# Patient Record
Sex: Male | Born: 1935 | Race: White | Hispanic: No | Smoking: Former smoker
Health system: Southern US, Community
[De-identification: ages and names within clinical notes are randomized; demographics above are authoritative.]

## PROBLEM LIST (undated history)

## (undated) DIAGNOSIS — F0390 Unspecified dementia without behavioral disturbance: Secondary | ICD-10-CM

## (undated) HISTORY — PX: PACEMAKER PLACEMENT: SHX43

---

## 2005-11-24 ENCOUNTER — Ambulatory Visit: Payer: Self-pay | Admitting: Gastroenterology

## 2010-08-17 ENCOUNTER — Ambulatory Visit: Payer: Self-pay | Admitting: Internal Medicine

## 2014-07-23 ENCOUNTER — Ambulatory Visit
Admit: 2014-07-23 | Disposition: A | Discharge status: Home / Self Care | Payer: Self-pay | Attending: Cardiology | Admitting: Cardiology

## 2014-07-28 ENCOUNTER — Ambulatory Visit
Admit: 2014-07-28 | Disposition: A | Discharge status: Home / Self Care | Payer: Self-pay | Attending: Cardiology | Admitting: Cardiology

## 2014-07-29 LAB — CBC WITH DIFFERENTIAL/PLATELET
Basophil #: 0 10*3/uL (ref 0.0–0.1)
Basophil %: 0.5 %
Eosinophil #: 0.2 10*3/uL (ref 0.0–0.7)
Eosinophil %: 2.2 %
HCT: 37.2 % — ABNORMAL LOW (ref 40.0–52.0)
HGB: 12.4 g/dL — ABNORMAL LOW (ref 13.0–18.0)
Lymphocyte #: 1.6 10*3/uL (ref 1.0–3.6)
Lymphocyte %: 22.5 %
MCH: 33.3 pg (ref 26.0–34.0)
MCHC: 33.2 g/dL (ref 32.0–36.0)
MCV: 100 fL (ref 80–100)
Monocyte #: 0.6 x10 3/mm (ref 0.2–1.0)
Monocyte %: 8.5 %
Neutrophil #: 4.7 10*3/uL (ref 1.4–6.5)
Neutrophil %: 66.3 %
Platelet: 156 10*3/uL (ref 150–440)
RBC: 3.71 10*6/uL — ABNORMAL LOW (ref 4.40–5.90)
RDW: 14.8 % — ABNORMAL HIGH (ref 11.5–14.5)
WBC: 7 10*3/uL (ref 3.8–10.6)

## 2014-07-29 LAB — BASIC METABOLIC PANEL
Anion Gap: 7 (ref 7–16)
BUN: 21 mg/dL — ABNORMAL HIGH
Calcium, Total: 8.4 mg/dL — ABNORMAL LOW
Chloride: 108 mmol/L
Co2: 25 mmol/L
Creatinine: 0.89 mg/dL
EGFR (African American): >60
EGFR (Non-African Amer.): >60
Glucose: 114 mg/dL — ABNORMAL HIGH
Potassium: 3.5 mmol/L
Sodium: 140 mmol/L

## 2014-07-29 LAB — TSH: Thyroid Stimulating Horm: 3.699 u[IU]/mL

## 2014-08-03 NOTE — Consult Note (Addendum)
PATIENT NAME:  AMAL, SAIKI MR#:  [id number] DATE OF BIRTH:  25-Feb-1921  DATE OF CONSULTATION:  07/29/2014  REFERRING PHYSICIAN:  Dorothyann Peng, MD CONSULTING PHYSICIAN:  Kelton Pillar. Sheryle Hail, MD  REASON FOR CONSULTATION: Clinical question regarding postoperative agitation.   HISTORY OF PRESENT ILLNESS: This is a 81 year old Caucasian male who was admitted to the hospital for a pacemaker placement due to bradycardia. This evening, after the patient's procedure, he was very agitated and difficult to redirect. The procedure was without complication, but due to the patient's change in mental status his attending physicians called for input from the internal medicine service.   REVIEW OF SYSTEMS: CONSTITUTIONAL: The patient is sleeping soundly at this time and I did not want to wake him for this particular consult, but his nursing staff and his son who were present during the episode are available to answer questions. The patient denied any pain, nausea, vomiting, or shortness of breath. During the patient's acute episode of agitation, he reportedly did not express any complaints at that time either.   PAST MEDICAL HISTORY: A-fib, bradycardia and short term memory loss.   PAST SURGICAL HISTORY: Cataract removal and pacemaker placement today.   SOCIAL HISTORY: The patient lives alone but he drives. The patient plays tennis 2 times a week and plays in a weekly card game. He also attends church and takes care of his own cooking and cleaning.   FAMILY HISTORY: There are no significant chronic medical illnesses that run through the family.   MEDICATIONS: Simvastatin 20 mg 1 tablet p.o. at bedtime.   ALLERGIES: DAYPRO.   PERTINENT LABORATORY RESULTS AND RADIOGRAPHIC FINDINGS: Unavailable as the patient does not have any lab work.   PHYSICAL EXAMINATION: VITAL SIGNS: Temperature 97, pulse 66, respirations 18, blood pressure 180/116, and pulse oximetry of 94% on room air.  GENERAL: The patient is  sleeping comfortably, in no apparent distress.  HEENT: Normocephalic, atraumatic. Pupils equal, round, and reactive to light and accommodation. Mucous membranes are moist.  NECK: Trachea is midline. No adenopathy. Thyroid is nonpalpable and nontender.  CHEST: Symmetric and atraumatic. There is a surgical incision that is clean and dressed over the left chest.  CARDIOVASCULAR: Regular rate and rhythm. Normal S1, S2. No rubs, clicks, or murmurs appreciated.  LUNGS: There are transmitted upper airway sounds throughout all lung fields. The patient is snoring as he is lying on his back and sleeping. He also has a slight whistle that is heard best with the stethoscope above the cricoid notch.  ABDOMEN: Positive bowel sounds. Soft, nontender, nondistended. No hepatosplenomegaly.  GENITOURINARY: Deferred.  MUSCULOSKELETAL: The patient is observed moving all 4 extremities equally as he rolls in bed. Again, I have not awakened him to test his strength nor his gait.  SKIN: Warm and dry. There are no rashes or lesions.  EXTREMITIES: No clubbing, cyanosis, or edema.  NEUROLOGIC: Reportedly while the patient was awake, cranial nerves II through XII were grossly intact. There is no indication that the patient has had any stroke or cerebrovascular accident. PSYCHIATRIC: The patient is asleep, thus his mood is difficult to assess at this time, although notably when he was awake and agitated he was pleasant and nonviolent.   ASSESSMENT AND PLAN: This is a 81 year old male status post pacemaker implantation who appears to have been agitated due to postoperative delirium.  1.  Delirium. The patient has recently undergone anesthesia and apparently has some baseline mild cognitive impairment, which is a combination that makes delirium following a  procedure quite likely. He was initially given Ativan which in elderly patients can frequently have an opposite effect from the usual anxiolytic reaction. Prior to my examination,  the patient had been given some Haldol IV, which has helped him calm down and hopefully contributed to his falling asleep. I recommend obtaining baseline laboratories including basic metabolic panel as well as CBC. I have also ordered labs to look for reversible causes of delirium.  2.  Mild cognitive impairment, early dementia. The patient is very functional at home. His son says that the patient has never had any episodes of acute delirium such as this, however, he has noticed that the patient's short-term memory has worsened recently. He has also lost his keys and wallet recently, which indicate that he may benefit from some memory care or in-home help. Thus, I have ordered a discharge planning consult as well as physical therapy and occupational therapy evaluations to determine his level of need. Prior to this admission, the patient had actually expressed desire to move into an elderly independent living community. Given his ability to take care of most of his ADL, I would seriously doubt that he meets criteria for outright dementia and thus he likely has mild cognitive impairment. When the patient is awake and calm, which I fully expect he will be from sunrise, we can perform a mental status examination and score his level of dementia. At this time, I do not believe obtaining a head CT is necessary as there are very clear causes for delirium. If his mental status is not improved by morning, a head CT might be appropriate to rule out any cerebrovascular accident.  3.  Hypertension. High blood pressure was not listed on the patient's past medical history. It may be that he has an ill-fitting cuff or that he had been agitated at the time that his previous blood pressure readings were taken. I would assess blood pressure again in the morning when he is calmer and determine if he would benefit from an antihypertensive medication keeping in mind that we do not want to make the patient orthostatic and increase his  falls risk as she is very able-bodied at this time and lives a high quality of life.   Thank you very much for involving me in this patient's care. We will follow alongside the primary cardiology team.   ____________________________ Kelton PillarMichael S. Sheryle Hailiamond, MD msd:sb D: 07/29/2014 06:55:30 ET T: 07/29/2014 10:00:33 ET JOB#: [id number]  cc: Kelton PillarMichael S. Sheryle Hailiamond, MD, <Dictator> Kelton PillarMICHAEL S Alan Drummer MD ELECTRONICALLY SIGNED 08/05/2014 17:56

## 2014-08-03 NOTE — Consult Note (Signed)
Brief Consult Note: Diagnosis: post-operative delirium; mild-cognitive impairment/early dementia.   Patient was seen by consultant.   Consult note dictated.   Recommend further assessment or treatment.   Orders entered.   Comments: Obtain history and physical; check baseline labs.  Electronic Signatures: Arnaldo Nataliamond, Michael S (MD)  (Signed 26-Apr-16 01:02)  Authored: Brief Consult Note   Last Updated: 26-Apr-16 01:02 by Arnaldo Nataliamond, Michael S (MD)

## 2014-08-03 NOTE — Op Note (Addendum)
PATIENT NAME:  Mark Miller, [name] MR#:  [id number] DATE OF BIRTH:  1921-02-19  DATE OF PROCEDURE:  07/28/2014  PRIMARY CARE PHYSICIAN:  Stann Mainlandavid P. Sampson GoonFitzgerald, M.D.   PREPROCEDURE DIAGNOSIS: Bradycardia.   PROCEDURE: Single-chamber pacemaker implantation.  POSTPROCEDURAL DIAGNOSIS:  Intermittent ventricular pacing.   INDICATION: The patient is a 81 year old gentleman with 1 month history of increasing exertional dyspnea with generalized fatigue. Workup has revealed atrial fibrillation with a slow ventricular response with heart rates in the 40s.   DESCRIPTION OF PROCEDURE: The risks, benefits and alternatives of permanent pacemaker implantation were explained to the patient and informed written consent was obtained.  He was brought to the operating room in the fasting state. The left pectoral region was prepped and draped in the usual sterile manner. Anesthesia was obtained with 1% lidocaine locally. A 6 cm incision was performed over the left pectoral region. Access was obtained to the left subclavian vein by fine needle aspiration. Ventricular lead was positioned into the right ventricular apical septum. After proper thresholds were obtained, the lead was sutured in place. The pacemaker pocket was irrigated with gentamicin solution. The lead was connected to rate-responsive single-chamber pacemaker generator (Medtronic Adapta [id number]) and positioned in the pocket. The pocket was closed with 2-0 and 4-0 Vicryl, respectively. Steri-Strips and a pressure dressing were applied.    ____________________________ Marcina MillardAlexander Kenn Rekowski, MD ap:sp D: 07/28/2014 13:02:04 ET T: 07/28/2014 16:59:08 ET JOB#: [id number]  cc: Marcina MillardAlexander Shaughn Thomley, MD, <Dictator> Marcina MillardALEXANDER Phillipe Clemon MD ELECTRONICALLY SIGNED 08/02/2014 11:10

## 2015-08-28 ENCOUNTER — Emergency Department
Admission: EM | Admit: 2015-08-28 | Discharge: 2015-08-28 | Disposition: A | Discharge status: Skilled Nursing Facility | Payer: Medicare Other | Attending: Emergency Medicine | Admitting: Emergency Medicine

## 2015-08-28 ENCOUNTER — Emergency Department: Payer: Medicare Other

## 2015-08-28 ENCOUNTER — Encounter: Payer: Self-pay | Admitting: Emergency Medicine

## 2015-08-28 DIAGNOSIS — Z87891 Personal history of nicotine dependence: Secondary | ICD-10-CM | POA: Insufficient documentation

## 2015-08-28 DIAGNOSIS — R4182 Altered mental status, unspecified: Secondary | ICD-10-CM | POA: Diagnosis present

## 2015-08-28 DIAGNOSIS — F0391 Unspecified dementia with behavioral disturbance: Secondary | ICD-10-CM | POA: Diagnosis not present

## 2015-08-28 HISTORY — DX: Unspecified dementia, unspecified severity, without behavioral disturbance, psychotic disturbance, mood disturbance, and anxiety: F03.90

## 2015-08-28 LAB — CBC WITH DIFFERENTIAL/PLATELET
Basophils Absolute: 0 10*3/uL (ref 0–0.1)
Basophils Relative: 1 %
EOS PCT: 3 %
Eosinophils Absolute: 0.2 10*3/uL (ref 0–0.7)
HCT: 39.8 % — ABNORMAL LOW (ref 40.0–52.0)
HEMOGLOBIN: 13.1 g/dL (ref 13.0–18.0)
LYMPHS ABS: 2.7 10*3/uL (ref 1.0–3.6)
LYMPHS PCT: 40 %
MCH: 32.5 pg (ref 26.0–34.0)
MCHC: 33 g/dL (ref 32.0–36.0)
MCV: 98.6 fL (ref 80.0–100.0)
MONOS PCT: 8 %
Monocytes Absolute: 0.5 10*3/uL (ref 0.2–1.0)
Neutro Abs: 3.2 10*3/uL (ref 1.4–6.5)
Neutrophils Relative %: 48 %
Platelets: 124 10*3/uL — ABNORMAL LOW (ref 150–440)
RBC: 4.04 MIL/uL — ABNORMAL LOW (ref 4.40–5.90)
RDW: 14.6 % — ABNORMAL HIGH (ref 11.5–14.5)
WBC: 6.6 10*3/uL (ref 3.8–10.6)

## 2015-08-28 LAB — URINALYSIS COMPLETE WITH MICROSCOPIC (ARMC ONLY)
BILIRUBIN URINE: NEGATIVE
Bacteria, UA: NONE SEEN
GLUCOSE, UA: NEGATIVE mg/dL
Ketones, ur: NEGATIVE mg/dL
Leukocytes, UA: NEGATIVE
Nitrite: NEGATIVE
Protein, ur: NEGATIVE mg/dL
SPECIFIC GRAVITY, URINE: 1.024 (ref 1.005–1.030)
SQUAMOUS EPITHELIAL / LPF: NONE SEEN
pH: 5 (ref 5.0–8.0)

## 2015-08-28 LAB — COMPREHENSIVE METABOLIC PANEL
ALT: 23 U/L (ref 17–63)
AST: 34 U/L (ref 15–41)
Albumin: 4 g/dL (ref 3.5–5.0)
Alkaline Phosphatase: 69 U/L (ref 38–126)
Anion gap: 7 (ref 5–15)
BILIRUBIN TOTAL: 0.8 mg/dL (ref 0.3–1.2)
BUN: 30 mg/dL — AB (ref 6–20)
CO2: 24 mmol/L (ref 22–32)
Calcium: 8.8 mg/dL — ABNORMAL LOW (ref 8.9–10.3)
Chloride: 109 mmol/L (ref 101–111)
Creatinine, Ser: 0.94 mg/dL (ref 0.61–1.24)
GFR calc Af Amer: >60 mL/min (ref >60)
GFR calc non Af Amer: >60 mL/min (ref >60)
Glucose, Bld: 92 mg/dL (ref 65–99)
POTASSIUM: 4.1 mmol/L (ref 3.5–5.1)
Sodium: 140 mmol/L (ref 135–145)
TOTAL PROTEIN: 6.4 g/dL — AB (ref 6.5–8.1)

## 2015-08-28 LAB — GLUCOSE, CAPILLARY: Glucose-Capillary: 91 mg/dL (ref 65–99)

## 2015-08-28 MED ORDER — QUETIAPINE FUMARATE 25 MG PO TABS: 12.5000 mg | ORAL_TABLET | Freq: Two times a day (BID) | ORAL | Status: AC

## 2015-08-28 NOTE — ED Notes (Signed)
Per patient son, patient has a history of dementia, over the past few days his behavior has became more aggressive. [facility] son to come today because he became so aggressive. Patient denies any complaints at this time.  Patient son states that approximately 3 week ago patient was started on Celexa 20 mg.

## 2015-08-28 NOTE — Discharge Instructions (Signed)

## 2015-08-28 NOTE — ED Notes (Signed)
[facility] pt with hx of Dementia , with x1 week of increased alerted behavior with aggression. Pt with Healthcare  POA

## 2015-08-28 NOTE — ED Provider Notes (Addendum)
[facility] Emergency Department Provider Note  ____________________________________________   I have reviewed the triage vital signs and the nursing notes.   HISTORY  Chief Complaint Altered Mental Status    HPI Mark Miller Sr. is a 82 y.o. male presents today complaining of nothing. The patient has a history of dementia.He is able were to that, his family states that he has been getting gradually more demented over the last several years and recently had to be put in a facility. Over the last several weeks, there is been a gradual increase in his dissatisfaction with and outbursts about being in the facility. There is been no acute change for the last couple days. Apparently, he was adamant that he would go home today and this caused some concern. His doctor's office was closed apparently so they called the son asked him to be evaluated  to ensure there was no other issues such as an acute urinary tract infection. They feel that he is at his baseline. They think he is bored the facility. They do not wish him admitted at this time. Son states that the patient can do any pain that is required of him in terms of playing cards but cannot recall what he had for breakfast in the morning and this is been getting gradually worse over time   Past Medical History  Diagnosis Date  . Dementia     There are no active problems to display for this patient.   Past Surgical History  Procedure Laterality Date  . Pacemaker placement      No current outpatient prescriptions on file.  Allergies Review of patient's allergies indicates no known allergies.  No family history on file.  Social History Social History  Substance Use Topics  . Smoking status: Former Games developer  . Smokeless tobacco: None  . Alcohol Use: None    Review of Systems Limited second to patient dementia Constitutional: No fever/chills Eyes: No visual changes. ENT: No sore throat. No  stiff neck no neck pain Cardiovascular: Denies chest pain. Respiratory: Denies shortness of breath. Gastrointestinal:   no vomiting.  No diarrhea.  No constipation. Genitourinary: Negative for dysuria. Musculoskeletal: Negative lower extremity swelling Skin: Negative for rash. Neurological: Negative for headaches, focal weakness or numbness. 10-point ROS otherwise negative.  ____________________________________________   PHYSICAL EXAM:  VITAL SIGNS: ED Triage Vitals  Enc Vitals Group     BP 08/28/15 1617 120/74 mmHg     Pulse Rate 08/28/15 1617 62     Resp 08/28/15 1617 18     Temp 08/28/15 1617 98.1 F (36.7 C)     Temp Source 08/28/15 1617 Oral     SpO2 08/28/15 1617 97 %     Weight 08/28/15 1617 160 lb (72.576 kg)     Height 08/28/15 1617  (1.753 m)     Head Cir --      Peak Flow --      Pain Score --      Pain Loc --      Pain Edu? --      Excl. in GC? 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 Well appearing and in no acute distress. Pleasantly demented Eyes: Conjunctivae are normal. PERRL. EOMI. Head: Atraumatic. Nose: No congestion/rhinnorhea. Mouth/Throat: Mucous membranes are moist.  Oropharynx non-erythematous. Neck: No  stridor.   Nontender with no meningismus Cardiovascular: Normal rate, regular rhythm. Grossly normal heart sounds.  Good peripheral circulation. Respiratory: Normal respiratory effort.  No retractions. Lungs CTAB. Abdominal: Soft and nontender. No distention. No guarding no rebound Back:  There is no focal tenderness or step off there is no midline tenderness there are no lesions noted. there is no CVA tenderness Musculoskeletal: No lower extremity tenderness. No joint effusions, no DVT signs strong distal pulses no edema Neurologic:  Normal speech and language. No gross focal neurologic  deficits are appreciated.  Skin:  Skin is warm, dry and intact. No rash noted. Psychiatric: Mood and affect are normal. Speech and behavior are normal.  ____________________________________________   LABS (all labs ordered are listed, but only abnormal results are displayed)  Labs Reviewed  COMPREHENSIVE METABOLIC PANEL - Abnormal; Notable for the following:    BUN 30 (*)    Calcium 8.8 (*)    Total Protein 6.4 (*)    All other components within normal limits  URINALYSIS COMPLETEWITH MICROSCOPIC ([facility] ONLY) - Abnormal; Notable for the following:    Color, Urine YELLOW (*)    APPearance CLEAR (*)    Hgb urine dipstick 1+ (*)    All other components within normal limits  GLUCOSE, CAPILLARY  CBC WITH DIFFERENTIAL/PLATELET  CBG MONITORING, ED   ____________________________________________  EKG  I personally interpreted any EKGs ordered by me or triage  ____________________________________________  RADIOLOGY  I reviewed any imaging ordered by me or triage that were performed during my shift and, if possible, patient and/or family made aware of any abnormal findings. ____________________________________________   PROCEDURES  Procedure(s) performed: None  Critical Care performed: None  ____________________________________________   INITIAL IMPRESSION / ASSESSMENT AND PLAN / ED COURSE  Pertinent labs & imaging results that were available during my care of the patient were reviewed by me and considered in my medical decision making (see chart for details).  Patient here for ongoing dementia issues, no acute pathology noted no evidence of urinary tract infection afebrile vital signs reassuring I will get a chest x-ray as a precaution no indication for CT had no head trauma no acute change in mental status no neurologic deficits. Family does not wish the patient to be admitted to the hospital and I see no acute pressing issue today. He will need to follow closely with his  doctor for ongoing management of his dementia. The son would prefer not to have the patient moved into a memory ward, but they are aware that this might need to happen if the patient continues to deteriorate in terms of dementia  ----------------------------------------- 7:42 PM on 08/28/2015 -----------------------------------------  Discussed with Dr. Toni Amendlapacs of psychiatry feels that low dose twice a day 12.5 Seroquel may be a good adjuvant to the medication patient is already taking. We will give him that and see if that helps with close outpatient follow-up. ____________________________________________   FINAL CLINICAL IMPRESSION(S) / ED DIAGNOSES  Final diagnoses:  None      This chart was dictated using voice recognition software.  Despite best efforts to proofread,  errors can occur which can change meaning.     Jeanmarie PlantJames A Yatzil Clippinger, MD 08/28/15 1910  Jeanmarie PlantJames A Kamee Bobst, MD 08/28/15 [phone]

## 2015-09-24 ENCOUNTER — Encounter
Admission: RE | Admit: 2015-09-24 | Discharge: 2015-09-24 | Disposition: A | Discharge status: Home / Self Care | Payer: Medicare Other | Source: Ambulatory Visit | Attending: Internal Medicine | Admitting: Internal Medicine

## 2015-10-03 ENCOUNTER — Encounter
Admission: RE | Admit: 2015-10-03 | Discharge: 2015-10-03 | Disposition: A | Discharge status: Home / Self Care | Payer: Medicare Other | Source: Ambulatory Visit | Attending: Internal Medicine | Admitting: Internal Medicine

## 2016-05-05 IMAGING — CR DG CHEST 2V
1 series · 2 of 2 positions shown · non-contrast
Comparison: None.

CLINICAL DATA: Pre operative respiratory exam. The patient is to
have a pacemaker inserted. Atrial fibrillation.

EXAM:
CHEST  2 VIEW

[Series 1: w chest pa · 0.14mm/px · 2 of 2 slices shown]
[im 1/2]
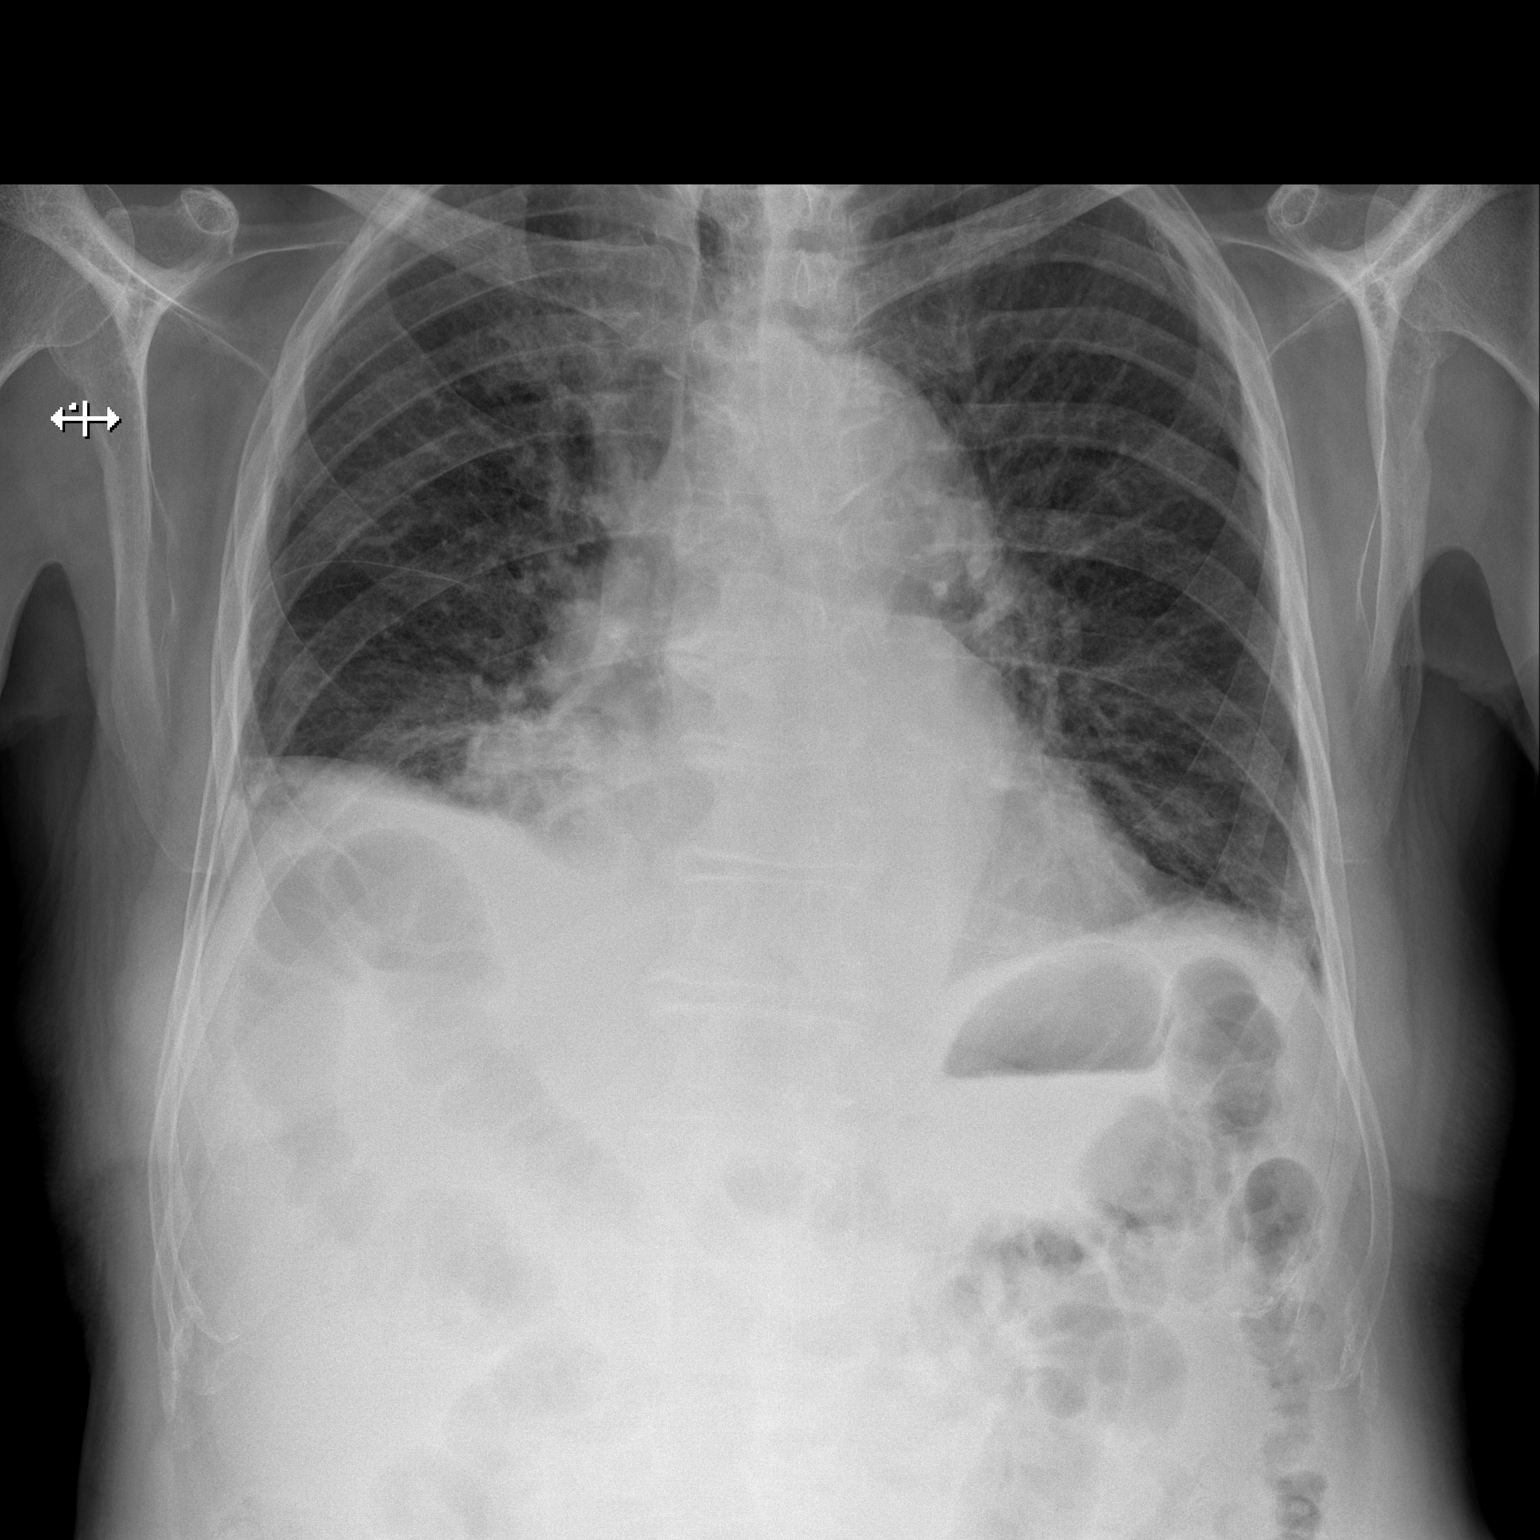
[im 2/2]
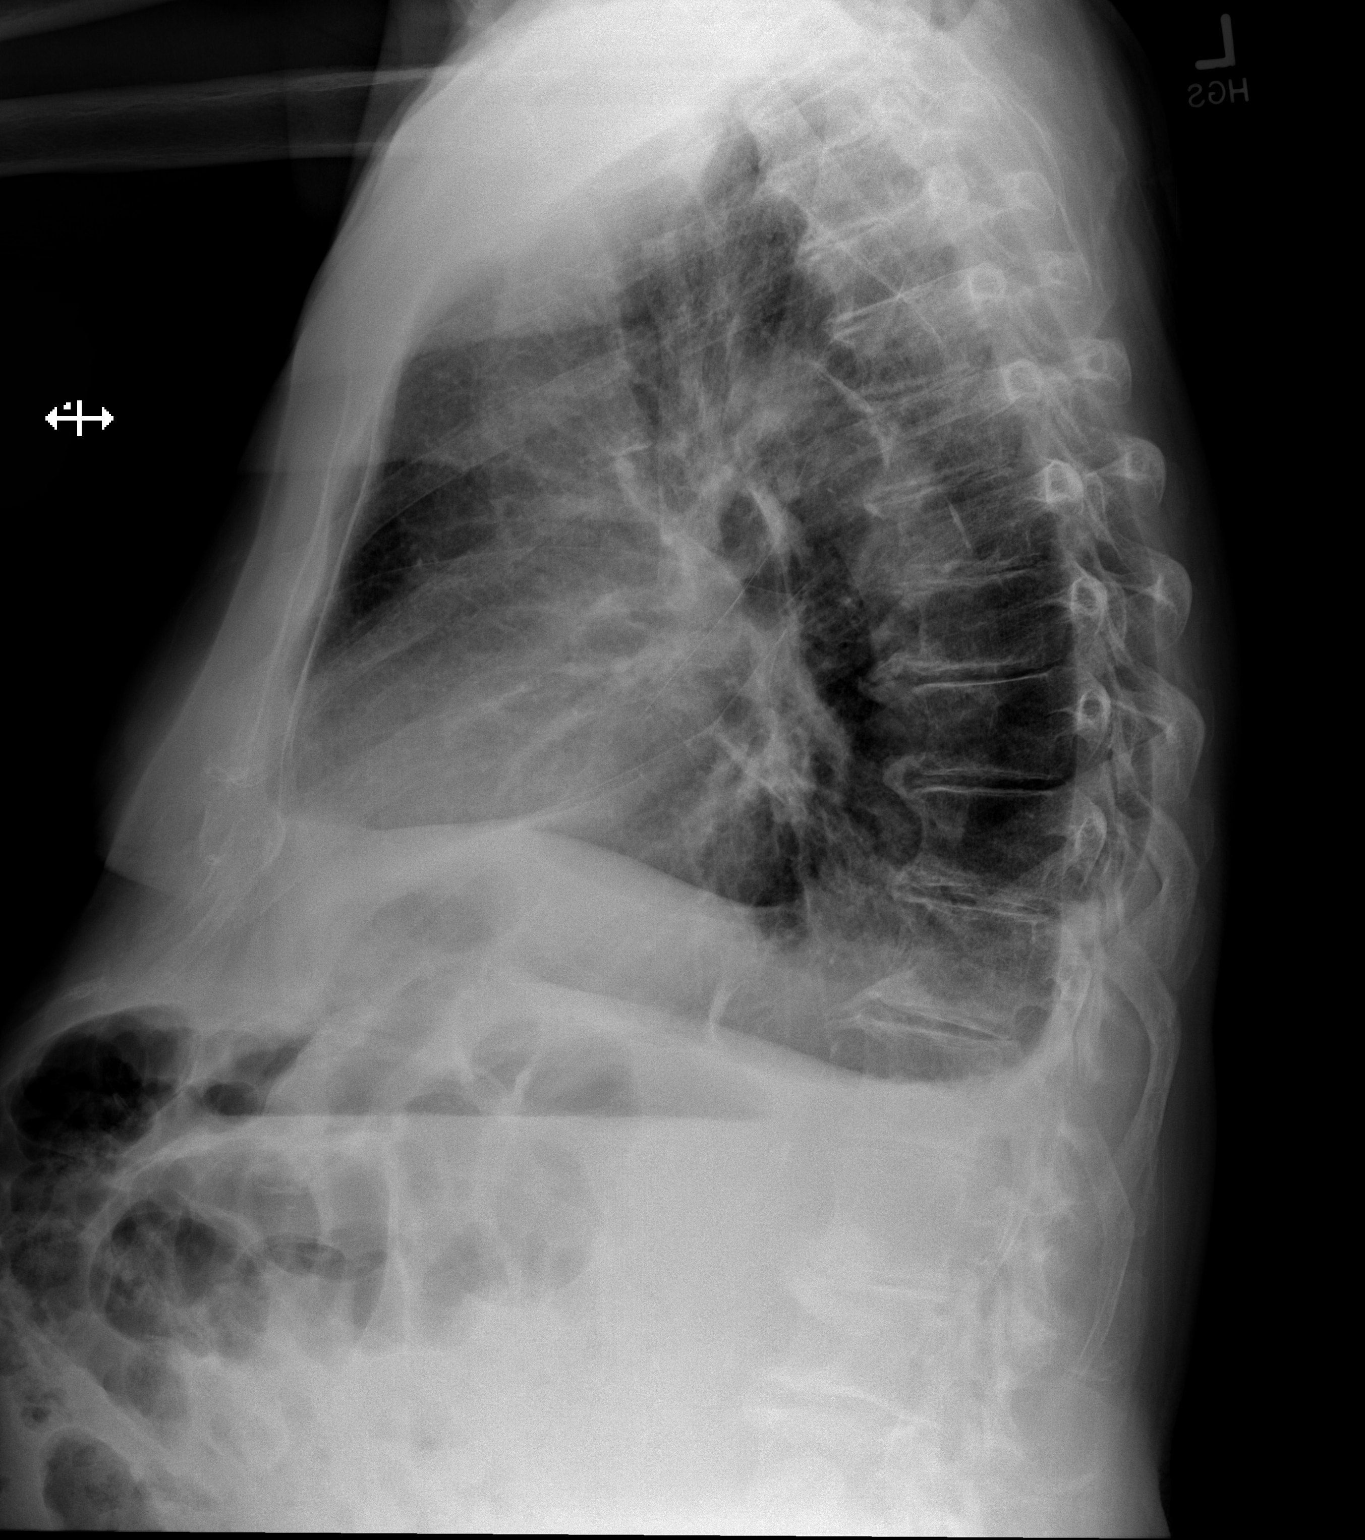

[2 of 2 positions shown; findings below may reference images not displayed]

FINDINGS: There is borderline cardiomegaly. Tortuosity and calcification of
the thoracic aorta. Small bilateral pleural effusions. Pulmonary
vascularity is normal. Slight atelectasis or infiltrate at the right
base posterior medially.

No acute osseous abnormality.
IMPRESSION: Right base atelectasis/ infiltrate.  Small bilateral effusions.

## 2016-05-10 IMAGING — CR DG CHEST 1V PORT
1 series · 1 of 1 positions shown · non-contrast
Comparison: July 23, 2014

CLINICAL DATA: Pacemaker placement

EXAM:
DG C-ARM 61-120 MIN; PORTABLE CHEST - 1 VIEW

[ap]
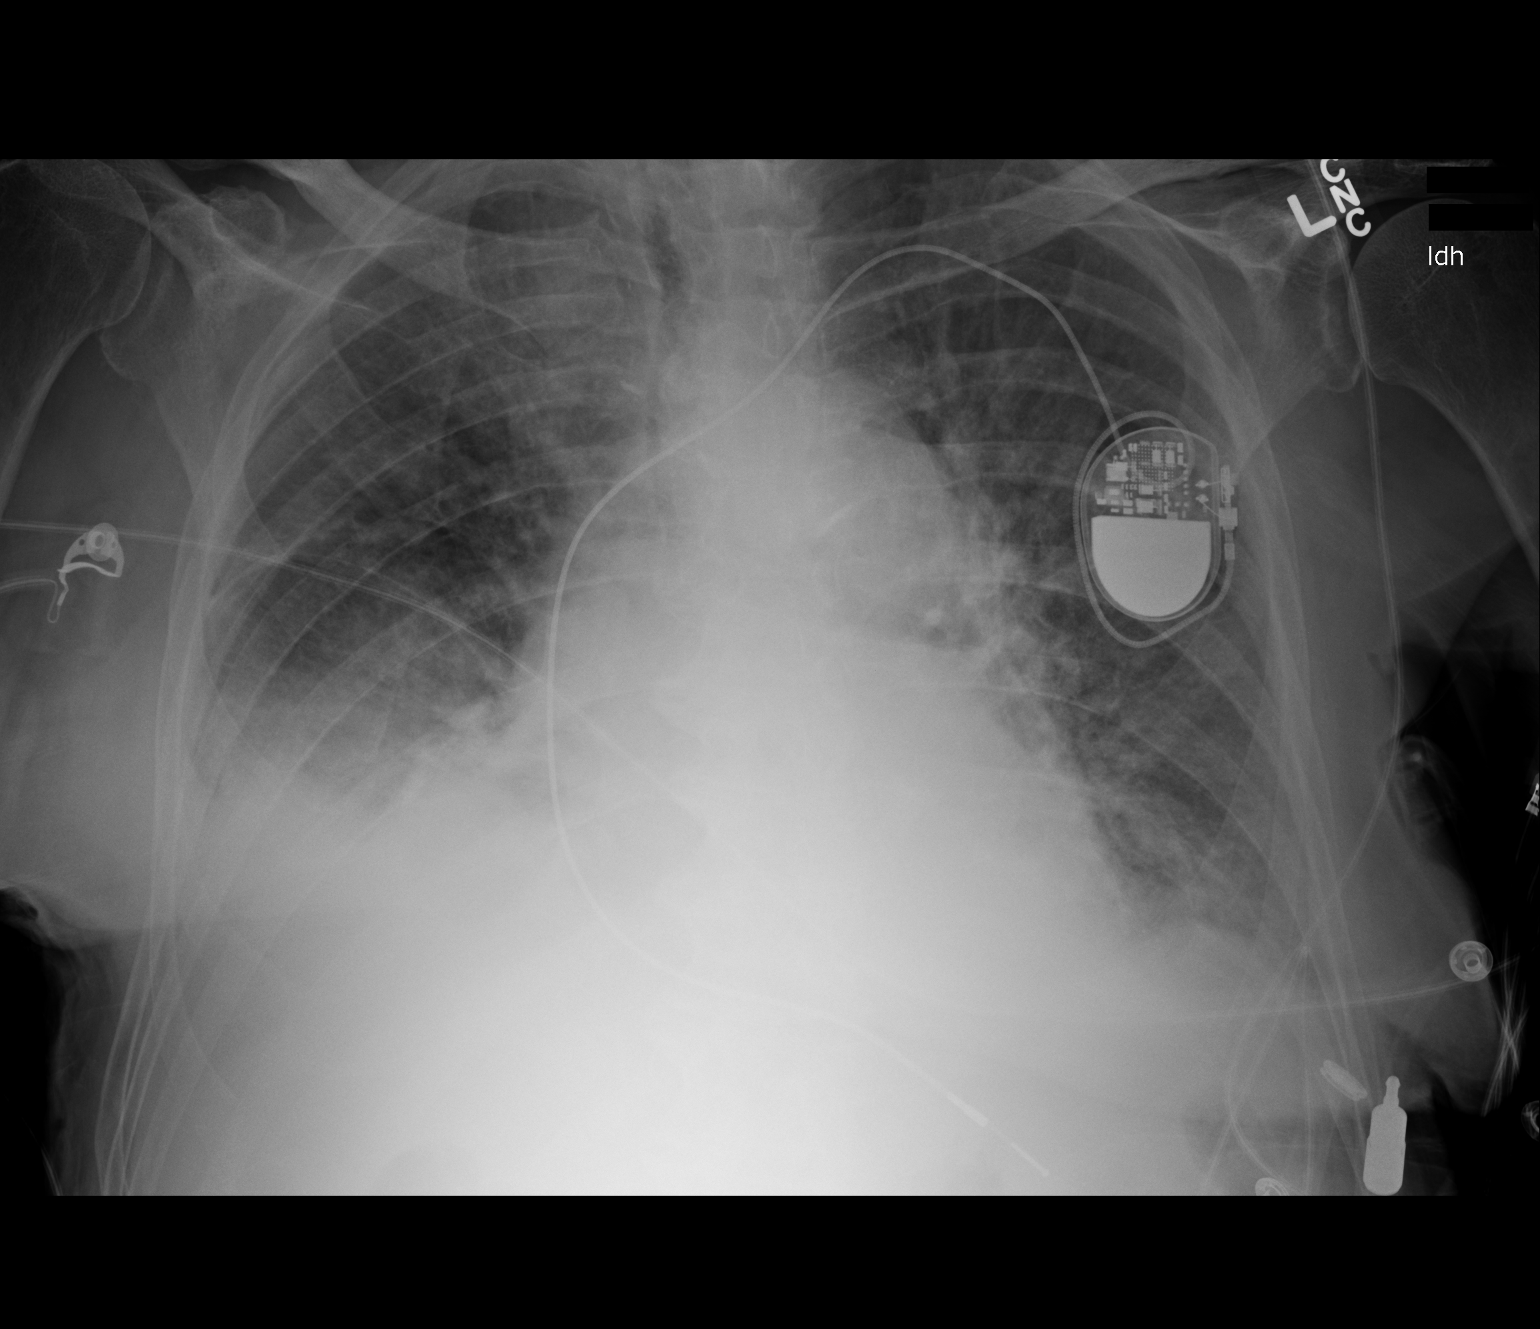

[1 of 1 positions shown; findings below may reference images not displayed]

FINDINGS: Pacemaker lead is attached to the right ventricular apex. No
pneumothorax. There is airspace consolidation in the right base with
small right effusion. There is patchy infiltrate in the medial left
base as well. Heart is upper normal in size with pulmonary
vascularity within normal limits. No adenopathy.
IMPRESSION: Pacemaker lead tip attached to the right ventricular apex region. No
pneumothorax. Bibasilar airspace consolidation, more on the right
than on the left. Small right effusion. No change in cardiac
silhouette.

## 2017-06-10 IMAGING — CR DG CHEST 2V
2 series · 3 of 3 positions shown · non-contrast
Comparison: 07/28/2014

CLINICAL DATA: Worsening dementia.

EXAM:
CHEST  2 VIEW

[chest pa]
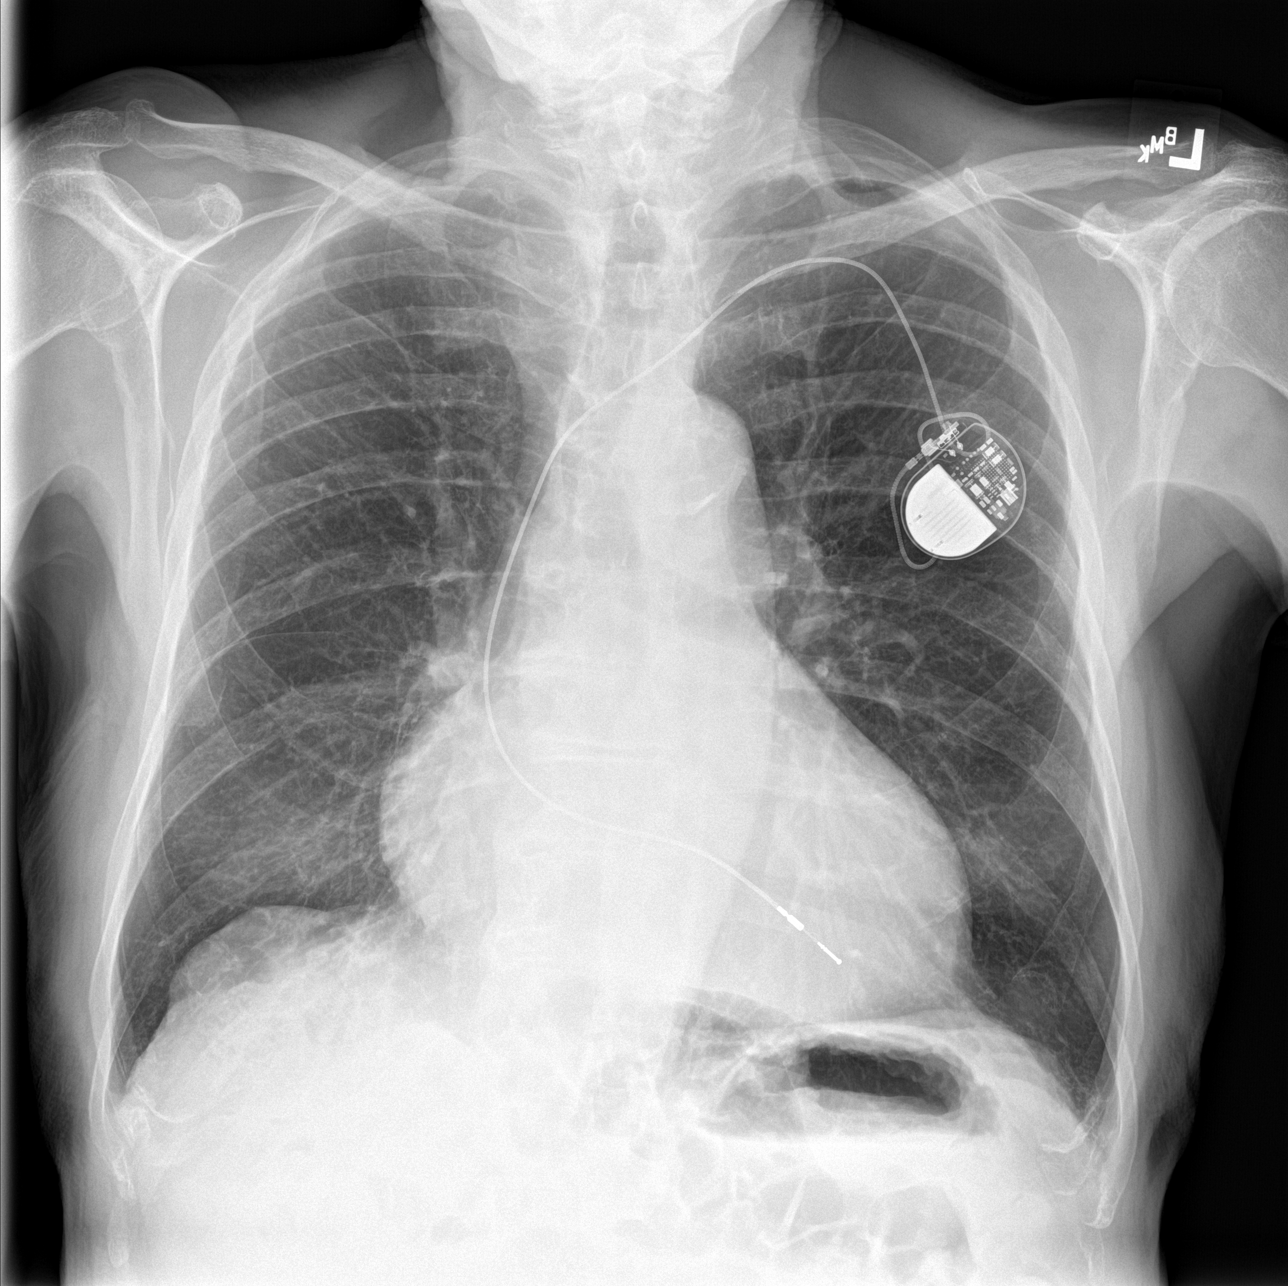

[Series 2: chest lat · 0.14mm/px · 2 of 2 slices shown]
[im 1/2]
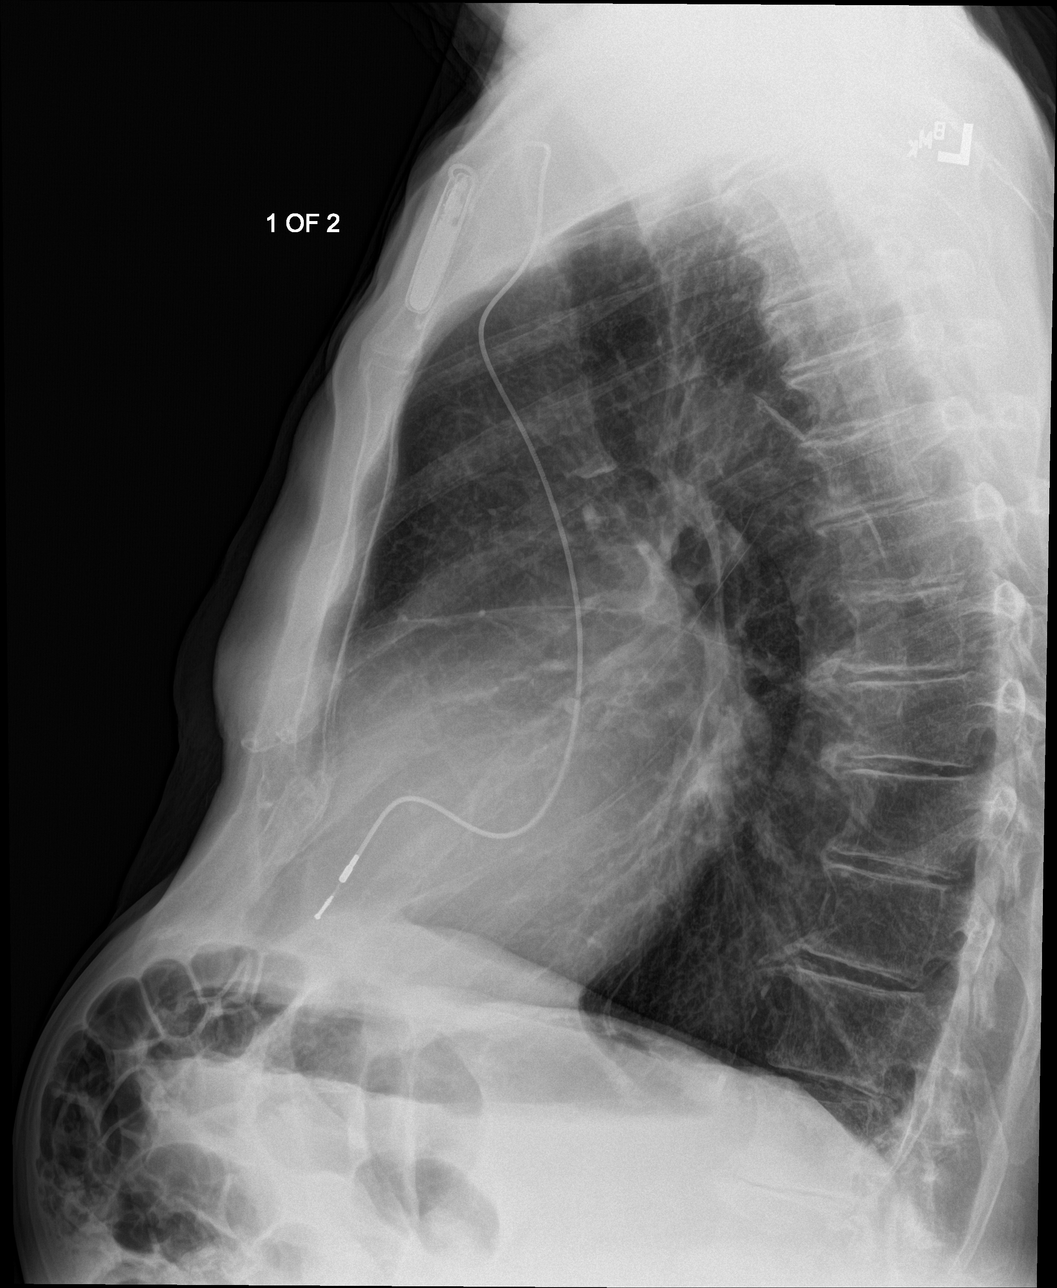
[im 2/2]
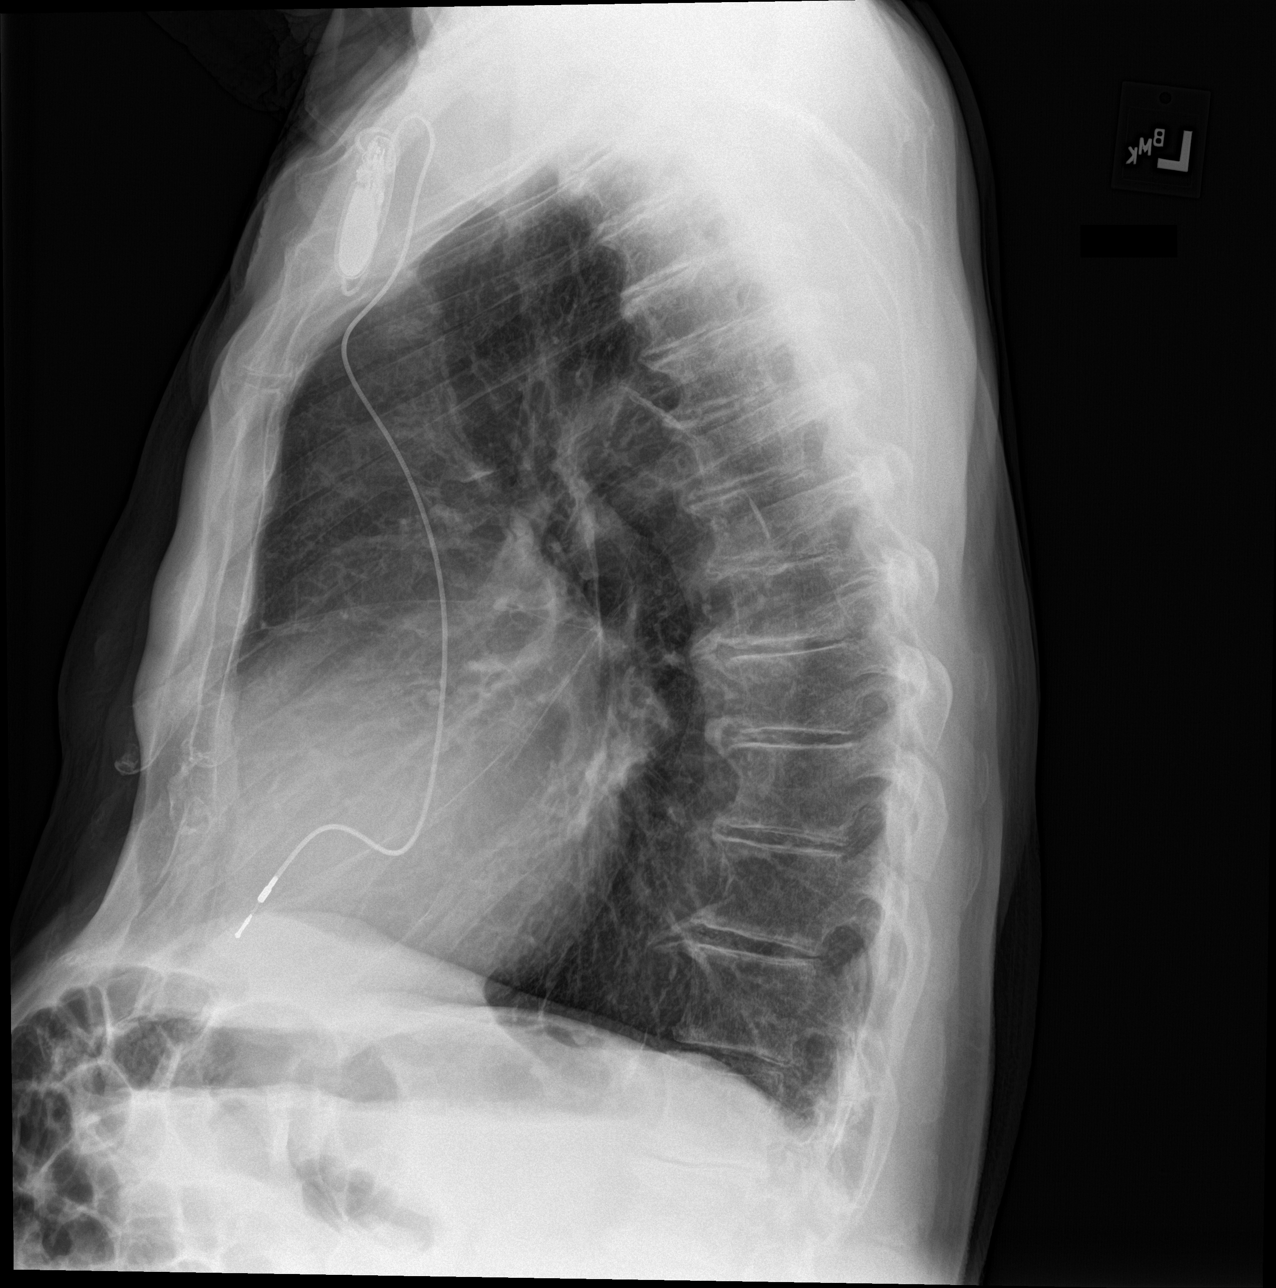

[3 of 3 positions shown; findings below may reference images not displayed]

FINDINGS: Moderate thoracic spondylosis. Single lead pacer tip at right
ventricle. Midline trachea. Mild cardiomegaly with a tortuous
atherosclerotic thoracic aorta. No pleural effusion or pneumothorax.
No congestive failure. Clear lungs.
IMPRESSION: Cardiomegaly without congestive failure.

## 2019-09-03 DEATH — deceased
